# Patient Record
Sex: Female | Born: 1970
Health system: Southern US, Community
[De-identification: ages and names within clinical notes are randomized; demographics above are authoritative.]

## PROBLEM LIST (undated history)

## (undated) DIAGNOSIS — N809 Endometriosis, unspecified: Secondary | ICD-10-CM

## (undated) DIAGNOSIS — I10 Essential (primary) hypertension: Secondary | ICD-10-CM

## (undated) DIAGNOSIS — K219 Gastro-esophageal reflux disease without esophagitis: Secondary | ICD-10-CM

## (undated) HISTORY — PX: BREAST SURGERY: SHX581

## (undated) HISTORY — PX: EXCISION OF ENDOMETRIOMA: SHX6473

---

## 2002-02-20 ENCOUNTER — Encounter: Payer: Self-pay | Admitting: Orthopedic Surgery

## 2002-02-20 ENCOUNTER — Ambulatory Visit (HOSPITAL_COMMUNITY): Admission: RE | Admit: 2002-02-20 | Discharge: 2002-02-20 | Payer: Self-pay | Admitting: Orthopedic Surgery

## 2010-06-26 ENCOUNTER — Ambulatory Visit (HOSPITAL_COMMUNITY): Admission: RE | Admit: 2010-06-26 | Discharge: 2010-06-26 | Payer: Self-pay | Admitting: General Surgery

## 2011-02-23 LAB — CBC
HCT: 41.9 % (ref 36.0–46.0)
Hemoglobin: 14.4 g/dL (ref 12.0–15.0)
MCH: 31.8 pg (ref 26.0–34.0)
MCHC: 34.4 g/dL (ref 30.0–36.0)
MCV: 92.4 fL (ref 78.0–100.0)
Platelets: 352 10*3/uL (ref 150–400)
RBC: 4.53 MIL/uL (ref 3.87–5.11)
RDW: 13.2 % (ref 11.5–15.5)
WBC: 8.4 10*3/uL (ref 4.0–10.5)

## 2011-02-23 LAB — DIFFERENTIAL
Basophils Absolute: 0 10*3/uL (ref 0.0–0.1)
Basophils Relative: 1 % (ref 0–1)
Eosinophils Absolute: 0.2 10*3/uL (ref 0.0–0.7)
Eosinophils Relative: 2 % (ref 0–5)
Lymphocytes Relative: 22 % (ref 12–46)
Lymphs Abs: 1.9 10*3/uL (ref 0.7–4.0)
Monocytes Absolute: 0.6 10*3/uL (ref 0.1–1.0)
Monocytes Relative: 7 % (ref 3–12)
Neutro Abs: 5.7 10*3/uL (ref 1.7–7.7)
Neutrophils Relative %: 68 % (ref 43–77)

## 2011-02-23 LAB — HCG, QUANTITATIVE, PREGNANCY: hCG, Beta Chain, Quant, S: <2 m[IU]/mL (ref <5)

## 2011-02-23 LAB — SURGICAL PCR SCREEN
MRSA, PCR: POSITIVE — AB
Staphylococcus aureus: POSITIVE — AB

## 2017-09-22 ENCOUNTER — Emergency Department (HOSPITAL_BASED_OUTPATIENT_CLINIC_OR_DEPARTMENT_OTHER)
Admission: EM | Admit: 2017-09-22 | Discharge: 2017-09-22 | Disposition: A | Discharge status: Home / Self Care | Payer: Self-pay | Attending: Emergency Medicine | Admitting: Emergency Medicine

## 2017-09-22 ENCOUNTER — Encounter (HOSPITAL_BASED_OUTPATIENT_CLINIC_OR_DEPARTMENT_OTHER): Payer: Self-pay | Admitting: *Deleted

## 2017-09-22 DIAGNOSIS — Z79899 Other long term (current) drug therapy: Secondary | ICD-10-CM | POA: Insufficient documentation

## 2017-09-22 DIAGNOSIS — I10 Essential (primary) hypertension: Secondary | ICD-10-CM | POA: Insufficient documentation

## 2017-09-22 DIAGNOSIS — J0100 Acute maxillary sinusitis, unspecified: Secondary | ICD-10-CM | POA: Insufficient documentation

## 2017-09-22 DIAGNOSIS — J069 Acute upper respiratory infection, unspecified: Secondary | ICD-10-CM | POA: Insufficient documentation

## 2017-09-22 DIAGNOSIS — B9789 Other viral agents as the cause of diseases classified elsewhere: Secondary | ICD-10-CM | POA: Insufficient documentation

## 2017-09-22 HISTORY — DX: Essential (primary) hypertension: I10

## 2017-09-22 HISTORY — DX: Endometriosis, unspecified: N80.9

## 2017-09-22 HISTORY — DX: Gastro-esophageal reflux disease without esophagitis: K21.9

## 2017-09-22 MED ORDER — BENZONATATE 100 MG PO CAPS
100.0000 mg | ORAL_CAPSULE | Freq: Once | ORAL | Status: AC
  Administered 2017-09-22: 100 mg via ORAL
  Filled 2017-09-22: qty 1

## 2017-09-22 MED ORDER — DEXAMETHASONE 6 MG PO TABS
10.0000 mg | ORAL_TABLET | Freq: Once | ORAL | Status: AC
  Administered 2017-09-22: 10 mg via ORAL
  Filled 2017-09-22: qty 1

## 2017-09-22 MED ORDER — AMOXICILLIN-POT CLAVULANATE 875-125 MG PO TABS
1.0000 | ORAL_TABLET | Freq: Once | ORAL | Status: AC
  Administered 2017-09-22: 1 via ORAL
  Filled 2017-09-22: qty 1

## 2017-09-22 MED ORDER — BENZONATATE 100 MG PO CAPS: 100.0000 mg | ORAL_CAPSULE | Freq: Three times a day (TID) | ORAL | 0 refills | Status: DC

## 2017-09-22 MED ORDER — AMOXICILLIN-POT CLAVULANATE 875-125 MG PO TABS
1.0000 | ORAL_TABLET | Freq: Two times a day (BID) | ORAL | 0 refills | Status: DC
Start: 2017-09-22 — End: 2018-02-21

## 2017-09-22 MED FILL — AMOX-CLAV 875-125 MG TABLET: 875-125 | 7 days supply | Qty: 14 | Fill #0

## 2017-09-22 MED FILL — BENZONATATE 100 MG CAPSULE: 100 | 7 days supply | Qty: 21 | Fill #0

## 2017-09-22 NOTE — ED Triage Notes (Signed)
Pt reports sinus pressure, facial pain, cough x 1 week.

## 2017-09-22 NOTE — Discharge Instructions (Signed)
Take tylenol 2 pills 4 times a day and motrin 4 pills 3 times a day.  Drink plenty of fluids.  Return for worsening shortness of breath, headache, confusion. Follow up with your family doctor.   

## 2017-09-22 NOTE — ED Provider Notes (Addendum)
[facility] EMERGENCY DEPARTMENT Provider Note   CSN: [id number] Arrival date & time: 09/22/17  0755     History   Chief Complaint Chief Complaint  Patient presents with  . Cough    HPI Tammy Sanders is a 46 y.o. female.  46 yo F with a cc of cough and congestion going on for the past week.  Has had sick two year olds around her.  Denies smoking hx, denies fevers, chest pain, shortness of breath.  Her main concern is her right sided facial pain and significant nasal congestion.  Going on since yesterday.  Has been taking afirin for the past week initially with improvement.  Also taking mucinex and a decongestant.     The history is provided by the patient.  Illness  This is a new problem. The current episode started more than 1 week ago. The problem occurs constantly. The problem has been gradually worsening. Pertinent negatives include no chest pain, no headaches and no shortness of breath. Nothing aggravates the symptoms. Nothing relieves the symptoms. She has tried nothing for the symptoms. The treatment provided no relief.    Past Medical History:  Diagnosis Date  . Acid reflux   . Endometriosis   . Hypertension     There are no active problems to display for this patient.   History reviewed. No pertinent surgical history.  OB History    No data available       Home Medications    Prior to Admission medications   Medication Sig Start Date End Date Taking? Authorizing Provider  carvedilol (COREG) 12.5 MG tablet Take 12.5 mg by mouth 2 (two) times daily with a meal.   Yes [provider]  omeprazole (PRILOSEC) 20 MG capsule Take 20 mg by mouth daily.   Yes [provider]  amoxicillin-clavulanate (AUGMENTIN) 875-125 MG tablet Take 1 tablet by mouth every 12 (twelve) hours. 09/22/17   Melene Plan, DO  benzonatate (TESSALON) 100 MG capsule Take 1 capsule (100 mg total) by mouth every 8 (eight) hours. 09/22/17   Melene Plan, DO     Family History History reviewed. No pertinent family history.  Social History Social History  Substance Use Topics  . Smoking status: Never Smoker  . Smokeless tobacco: Never Used  . Alcohol use Not on file     Allergies   Patient has no known allergies.   Review of Systems Review of Systems  Constitutional: Negative for chills and fever.  HENT: Positive for congestion. Negative for rhinorrhea.   Eyes: Negative for redness and visual disturbance.  Respiratory: Positive for cough. Negative for shortness of breath and wheezing.   Cardiovascular: Negative for chest pain and palpitations.  Gastrointestinal: Negative for nausea and vomiting.  Genitourinary: Negative for dysuria and urgency.  Musculoskeletal: Negative for arthralgias and myalgias.  Skin: Negative for pallor and wound.  Neurological: Negative for dizziness and headaches.     Physical Exam Updated Vital Signs BP (!) 165/106 (BP Location: Right Arm)   Pulse 96   Temp 98.2 F (36.8 C) (Oral)   Resp 18   Ht 5' 7.5" (1.715 m)   Wt 84.8 kg (187 lb)   LMP 08/28/2017   SpO2 100%   BMI 28.86 kg/m   Physical Exam  Constitutional: She is oriented to person, place, and time. She appears well-developed and well-nourished. No distress.  HENT:  Head: Normocephalic and atraumatic.  Swollen turbinates, posterior nasal drip, R maxillary sinus TTP, tm normal bilaterally.  Eyes: Pupils are equal, round, and reactive to light. EOM are normal.  Neck: Normal range of motion. Neck supple.  Cardiovascular: Normal rate and regular rhythm.  Exam reveals no gallop and no friction rub.   No murmur heard. Pulmonary/Chest: Effort normal. She has no wheezes. She has no rales.  Abdominal: Soft. She exhibits no distension and no mass. There is no tenderness. There is no guarding.  Musculoskeletal: She exhibits no edema or tenderness.  Neurological: She is alert and oriented to person, place, and time.  Skin: Skin is warm  and dry. She is not diaphoretic.  Psychiatric: She has a normal mood and affect. Her behavior is normal.  Nursing note and vitals reviewed.    ED Treatments / Results  Labs (all labs ordered are listed, but only abnormal results are displayed) Labs Reviewed - No data to display  EKG  EKG Interpretation None       Radiology No results found.  Procedures Procedures (including critical care time)  Medications Ordered in ED Medications  dexamethasone (DECADRON) tablet 10 mg (not administered)  amoxicillin-clavulanate (AUGMENTIN) 875-125 MG per tablet 1 tablet (not administered)  benzonatate (TESSALON) capsule 100 mg (not administered)     Initial Impression / Assessment and Plan / ED Course  I have reviewed the triage vital signs and the nursing notes.  Pertinent labs & imaging results that were available during my care of the patient were reviewed by me and considered in my medical decision making (see chart for details).     46 yo F with URI like symptoms. She does have some significant right maxillary sinus tenderness. Will start her on Augmentin for sinusitis. I think most likely the patient has rebound congestion from using Afrin for a prolonged period. I discussed with her that this should be used for no more than 3 days at a time. She states that she normally gets steroids when she gets like this and her symptoms improve. I'll give her a dose of Decadron. The patient was also noted to be hypertensive here today. That could likely be due to her Afrin use. Have her follow-up with her family physician.  8:27 AM:  I have discussed the diagnosis/risks/treatment options with the patient and believe the pt to be eligible for discharge home to follow-up with PCP. We also discussed returning to the ED immediately if new or worsening sx occur. We discussed the sx which are most concerning (e.g., sob, confusion, fever, inability to tolerate by mouth) that necessitate immediate  return. Medications administered to the patient during their visit and any new prescriptions provided to the patient are listed below.  Medications given during this visit Medications  dexamethasone (DECADRON) tablet 10 mg (not administered)  amoxicillin-clavulanate (AUGMENTIN) 875-125 MG per tablet 1 tablet (not administered)  benzonatate (TESSALON) capsule 100 mg (not administered)     The patient appears reasonably screen and/or stabilized for discharge and I doubt any other medical condition or other [facility] requiring further screening, evaluation, or treatment in the ED at this time prior to discharge.    Final Clinical Impressions(s) / ED Diagnoses   Final diagnoses:  Acute maxillary sinusitis, recurrence not specified  Viral URI with cough    New Prescriptions New Prescriptions   AMOXICILLIN-CLAVULANATE (AUGMENTIN) 875-125 MG TABLET    Take 1 tablet by mouth every 12 (twelve) hours.   BENZONATATE (TESSALON) 100 MG CAPSULE    Take 1 capsule (100 mg total) by mouth every 8 (eight) hours.  Melene Plan, DO 09/22/17 [phone]

## 2017-11-11 ENCOUNTER — Encounter: Payer: Self-pay | Admitting: Obstetrics & Gynecology

## 2018-02-21 ENCOUNTER — Emergency Department (HOSPITAL_BASED_OUTPATIENT_CLINIC_OR_DEPARTMENT_OTHER)
Admission: EM | Admit: 2018-02-21 | Discharge: 2018-02-21 | Disposition: A | Discharge status: Home / Self Care | Payer: Self-pay | Attending: Emergency Medicine | Admitting: Emergency Medicine

## 2018-02-21 ENCOUNTER — Encounter (HOSPITAL_BASED_OUTPATIENT_CLINIC_OR_DEPARTMENT_OTHER): Payer: Self-pay | Admitting: *Deleted

## 2018-02-21 ENCOUNTER — Other Ambulatory Visit: Payer: Self-pay

## 2018-02-21 DIAGNOSIS — I159 Secondary hypertension, unspecified: Secondary | ICD-10-CM

## 2018-02-21 DIAGNOSIS — Z79899 Other long term (current) drug therapy: Secondary | ICD-10-CM | POA: Insufficient documentation

## 2018-02-21 DIAGNOSIS — I10 Essential (primary) hypertension: Secondary | ICD-10-CM | POA: Insufficient documentation

## 2018-02-21 NOTE — ED Notes (Signed)
ED Provider at bedside. 

## 2018-02-21 NOTE — ED Provider Notes (Signed)
[facility] EMERGENCY DEPARTMENT Provider Note  CSN: [id number] Arrival date & time: 02/21/18 1303  Chief Complaint(s) No chief complaint on file.  HPI Tammy Sanders is a 47 y.o. female   The history is provided by the patient.  Hypertension  This is a chronic problem. The problem occurs daily. The problem has not changed since onset.Pertinent negatives include no chest pain, no abdominal pain, no headaches and no shortness of breath. Nothing aggravates the symptoms. Nothing relieves the symptoms.   Currently on Coreg. Compliant.  Past Medical History Past Medical History:  Diagnosis Date  . Acid reflux   . Endometriosis   . Hypertension    There are no active problems to display for this patient.  Home Medication(s) Prior to Admission medications   Medication Sig Start Date End Date Taking? Authorizing Provider  carvedilol (COREG) 12.5 MG tablet Take 12.5 mg by mouth 2 (two) times daily with a meal.    [provider]  omeprazole (PRILOSEC) 20 MG capsule Take 20 mg by mouth daily.    [provider]                                                                                                                                    Past Surgical History History reviewed. No pertinent surgical history. Family History History reviewed. No pertinent family history.  Social History Social History   Tobacco Use  . Smoking status: Never Smoker  . Smokeless tobacco: Never Used  Substance Use Topics  . Alcohol use: Not on file  . Drug use: Not on file   Allergies Patient has no known allergies.  Review of Systems Review of Systems  Respiratory: Negative for shortness of breath.   Cardiovascular: Negative for chest pain.  Gastrointestinal: Negative for abdominal pain.  Neurological: Negative for headaches.   All other systems are reviewed and are negative for acute change except as noted in the HPI  Physical Exam Vital Signs  I have  reviewed the triage vital signs BP (!) 141/93 (BP Location: Right Arm)   Pulse 83   Temp 98.3 F (36.8 C) (Oral)   Resp 18   LMP 02/20/2018   SpO2 98%   Physical Exam  Constitutional: She is oriented to person, place, and time. She appears well-developed and well-nourished. No distress.  HENT:  Head: Normocephalic and atraumatic.  Nose: Nose normal.  Eyes: Conjunctivae and EOM are normal. Pupils are equal, round, and reactive to light. Right eye exhibits no discharge. Left eye exhibits no discharge. No scleral icterus.  Neck: Normal range of motion. Neck supple.  Cardiovascular: Normal rate and regular rhythm. Exam reveals no gallop and no friction rub.  No murmur heard. Pulmonary/Chest: Effort normal and breath sounds normal. No stridor. No respiratory distress. She has no rales.  Abdominal: Soft. She exhibits no distension. There is no tenderness.  Musculoskeletal: She exhibits no edema or tenderness.  Neurological: She is alert and oriented to person, place, and time.  Skin: Skin is warm and dry. No rash noted. She is not diaphoretic. No erythema.  Psychiatric: She has a normal mood and affect.  Vitals reviewed.   ED Results and Treatments Labs (all labs ordered are listed, but only abnormal results are displayed) Labs Reviewed - No data to display                                                                                                                       EKG  EKG Interpretation  Date/Time:    Ventricular Rate:    PR Interval:    QRS Duration:   QT Interval:    QTC Calculation:   R Axis:     Text Interpretation:        Radiology No results found. Pertinent labs & imaging results that were available during my care of the patient were reviewed by me and considered in my medical decision making (see chart for details).  Medications Ordered in ED Medications - No data to display                                                                                                                                   Procedures Procedures  (including critical care time)  Medical Decision Making / ED Course I have reviewed the nursing notes for this encounter and the patient's prior records (if available in EHR or on provided paperwork).    Asymptomatic HTN. Started new diet and exercise. Has f/u with PCP in 3 months. No need for diagnostics at this time.  The patient appears reasonably screened and/or stabilized for discharge and I doubt any other medical condition or other [facility] requiring further screening, evaluation, or treatment in the ED at this time prior to discharge.  The patient is safe for discharge with strict return precautions.   Final Clinical Impression(s) / ED Diagnoses Final diagnoses:  None   Disposition: Discharge  Condition: Good  I have discussed the results, Dx and Tx plan with the patient who expressed understanding and agree(s) with the plan. Discharge instructions discussed at great length. The patient was given strict return precautions who verbalized understanding of the instructions. No further questions at time of discharge.    ED Discharge Orders    None       Follow Up: Center, [facility] [address] [place] [id number] [phone]   as scheduled      This chart was dictated using voice recognition software.  Despite best efforts to proofread,  errors can occur which can change the documentation meaning.   Nira Connardama, Atul Delucia Eduardo, MD 02/22/18 [phone]

## 2018-02-21 NOTE — ED Triage Notes (Addendum)
Elevated BP x 1 week. States that she has been to her PCP 1 week ago and was told to 'watch it'.  Denies HA.

## 2019-01-12 ENCOUNTER — Encounter (HOSPITAL_BASED_OUTPATIENT_CLINIC_OR_DEPARTMENT_OTHER): Payer: Self-pay | Admitting: Emergency Medicine

## 2019-01-12 ENCOUNTER — Other Ambulatory Visit: Payer: Self-pay

## 2019-01-12 ENCOUNTER — Emergency Department (HOSPITAL_BASED_OUTPATIENT_CLINIC_OR_DEPARTMENT_OTHER)
Admission: EM | Admit: 2019-01-12 | Discharge: 2019-01-12 | Disposition: A | Discharge status: Home / Self Care | Payer: Self-pay | Attending: Emergency Medicine | Admitting: Emergency Medicine

## 2019-01-12 ENCOUNTER — Emergency Department (HOSPITAL_BASED_OUTPATIENT_CLINIC_OR_DEPARTMENT_OTHER): Payer: Self-pay

## 2019-01-12 DIAGNOSIS — I10 Essential (primary) hypertension: Secondary | ICD-10-CM | POA: Insufficient documentation

## 2019-01-12 DIAGNOSIS — J181 Lobar pneumonia, unspecified organism: Secondary | ICD-10-CM

## 2019-01-12 DIAGNOSIS — J189 Pneumonia, unspecified organism: Secondary | ICD-10-CM | POA: Insufficient documentation

## 2019-01-12 DIAGNOSIS — R05 Cough: Secondary | ICD-10-CM | POA: Insufficient documentation

## 2019-01-12 DIAGNOSIS — Z79899 Other long term (current) drug therapy: Secondary | ICD-10-CM | POA: Insufficient documentation

## 2019-01-12 DIAGNOSIS — R059 Cough, unspecified: Secondary | ICD-10-CM

## 2019-01-12 MED ORDER — DOXYCYCLINE HYCLATE 100 MG PO TABS
100.0000 mg | ORAL_TABLET | Freq: Once | ORAL | Status: AC
  Administered 2019-01-12: 100 mg via ORAL
  Filled 2019-01-12: qty 1

## 2019-01-12 MED ORDER — DOXYCYCLINE HYCLATE 100 MG PO CAPS: 100.0000 mg | ORAL_CAPSULE | Freq: Two times a day (BID) | ORAL | 0 refills | Status: AC

## 2019-01-12 NOTE — ED Notes (Signed)
Patient transported to X-ray 

## 2019-01-12 NOTE — Discharge Instructions (Signed)
Take doxycycline twice daily for a week for possible early pneumonia.   Continue albuterol as needed for wheezing   See your doctor  Return to ER if you have worse trouble breathing, cough, fevers.

## 2019-01-12 NOTE — ED Triage Notes (Signed)
Cough worsening x1 week.  Minimal yellow mucous. Had the flu a few weeks ago and never stopped coughing.

## 2019-01-12 NOTE — ED Provider Notes (Signed)
[facility] EMERGENCY DEPARTMENT Provider Note   CSN: [id number] Arrival date & time: 01/12/19  8676     History   Chief Complaint Chief Complaint  Patient presents with  . Cough    HPI BRINKLEE CRUSER is a 48 y.o. female history of reflux, hypertension here presenting with cough.  Patient states that several weeks ago, she was diagnosed with the flu and she finished a course of Tamiflu.  She states that since then she has been having persistent cough.  Over the last week, she states that the cough got worse and she has some yellowish sputum production.  Patient denies any fevers or chills or vomiting.  Patient denies any sick contacts.  She is prescribed albuterol and use it occasionally and last use was yesterday. Patient denies any hx of COPD or asthma, not a smoker.   The history is provided by the patient.    Past Medical History:  Diagnosis Date  . Acid reflux   . Endometriosis   . Hypertension     There are no active problems to display for this patient.   Past Surgical History:  Procedure Laterality Date  . BREAST SURGERY    . EXCISION OF ENDOMETRIOMA       OB History   No obstetric history on file.      Home Medications    Prior to Admission medications   Medication Sig Start Date End Date Taking? Authorizing Provider  carvedilol (COREG) 12.5 MG tablet Take 12.5 mg by mouth 2 (two) times daily with a meal.    [provider]  omeprazole (PRILOSEC) 20 MG capsule Take 20 mg by mouth daily.    [provider]    Family History No family history on file.  Social History Social History   Tobacco Use  . Smoking status: Never Smoker  . Smokeless tobacco: Never Used  Substance Use Topics  . Alcohol use: Never    Frequency: Never  . Drug use: Never     Allergies   Patient has no known allergies.   Review of Systems Review of Systems  Respiratory: Positive for cough.   All other systems reviewed and are  negative.    Physical Exam Updated Vital Signs BP 130/86 (BP Location: Right Arm)   Pulse 90   Temp 98.8 F (37.1 C) (Oral)   Resp 16   Ht 5\' 7"  (1.702 m)   Wt 81.2 kg   LMP 01/05/2019   SpO2 99%   BMI 28.04 kg/m   Physical Exam Vitals signs and nursing note reviewed.  Constitutional:      Appearance: Normal appearance.  HENT:     Head: Normocephalic.     Mouth/Throat:     Mouth: Mucous membranes are moist.  Eyes:     Pupils: Pupils are equal, round, and reactive to light.  Neck:     Musculoskeletal: Normal range of motion.  Cardiovascular:     Rate and Rhythm: Normal rate.  Pulmonary:     Effort: Pulmonary effort is normal.     Comments: No obvious wheezing or crackles, diminished on the bases  Abdominal:     General: Abdomen is flat.     Palpations: Abdomen is soft.  Musculoskeletal: Normal range of motion.  Skin:    General: Skin is warm.     Capillary Refill: Capillary refill takes less than 2 seconds.  Neurological:     General: No focal deficit present.     Mental Status:  She is alert.  Psychiatric:        Mood and Affect: Mood normal.      ED Treatments / Results  Labs (all labs ordered are listed, but only abnormal results are displayed) Labs Reviewed - No data to display  EKG None  Radiology No results found.  Procedures Procedures (including critical care time)  Medications Ordered in ED Medications - No data to display   Initial Impression / Assessment and Plan / ED Course  I have reviewed the triage vital signs and the nursing notes.  Pertinent labs & imaging results that were available during my care of the patient were reviewed by me and considered in my medical decision making (see chart for details).    HALLIE CHRISTOFFEL is a 48 y.o. female here with cough. Likely persistent cough after recent flu. Will get CXR to r/o pneumonia but she has no wheezing. She has albuterol at home and is not hypoxic.   7:56 AM Xray showed  possible L base atelectasis. Given that she has productive cough, will treat with doxycycline empirically for possible pneumonia.    Final Clinical Impressions(s) / ED Diagnoses   Final diagnoses:  None    ED Discharge Orders    None       Charlynne Pander, MD 01/12/19 [phone]

## 2019-01-24 ENCOUNTER — Other Ambulatory Visit: Payer: Self-pay

## 2019-01-24 ENCOUNTER — Encounter (HOSPITAL_BASED_OUTPATIENT_CLINIC_OR_DEPARTMENT_OTHER): Payer: Self-pay | Admitting: Emergency Medicine

## 2019-01-24 ENCOUNTER — Emergency Department (HOSPITAL_BASED_OUTPATIENT_CLINIC_OR_DEPARTMENT_OTHER)
Admission: EM | Admit: 2019-01-24 | Discharge: 2019-01-24 | Disposition: A | Discharge status: Home / Self Care | Payer: Self-pay | Attending: Emergency Medicine | Admitting: Emergency Medicine

## 2019-01-24 DIAGNOSIS — I10 Essential (primary) hypertension: Secondary | ICD-10-CM | POA: Insufficient documentation

## 2019-01-24 DIAGNOSIS — R0981 Nasal congestion: Secondary | ICD-10-CM

## 2019-01-24 DIAGNOSIS — Z79899 Other long term (current) drug therapy: Secondary | ICD-10-CM | POA: Insufficient documentation

## 2019-01-24 NOTE — ED Triage Notes (Addendum)
Pt states she has nasal congestion with cough, and stuffed up ears which is not improved since being recently sick.  Pt recently seen with flu and pneumonia.   Pt states her sinuses are bothering her.

## 2019-01-24 NOTE — ED Provider Notes (Signed)
[facility] EMERGENCY DEPARTMENT Provider Note   CSN: [id number] Arrival date & time: 01/24/19  2957     History   Chief Complaint Chief Complaint  Patient presents with  . Cough    HPI Tammy Sanders is a 48 y.o. female.  Patient has struggled for the last several weeks with a flulike illness and then a double course of pneumonia.  Recently seen here on February 5 started on doxycycline.  Chest x-ray on that day was not definitive for pneumonia.  But overall patient feeling better from a lung standpoint.  Patient now has a lot of sinus congestion and pressure in both ears.  Patient has a history of hypertension.  Patient was using Afrin for several days which did help some.  But now she is more congested.  Denies any fevers.  Denies any breathing problems.     Past Medical History:  Diagnosis Date  . Acid reflux   . Endometriosis   . Hypertension     There are no active problems to display for this patient.   Past Surgical History:  Procedure Laterality Date  . BREAST SURGERY    . EXCISION OF ENDOMETRIOMA       OB History   No obstetric history on file.      Home Medications    Prior to Admission medications   Medication Sig Start Date End Date Taking? Authorizing Provider  albuterol (PROVENTIL HFA;VENTOLIN HFA) 108 (90 Base) MCG/ACT inhaler INHALE 2 PUFFS BY MOUTH EVERY 4 TO 6 HOURS AS NEEDED FOR WHEEZING 12/31/18   [provider]  carvedilol (COREG) 12.5 MG tablet Take 12.5 mg by mouth 2 (two) times daily with a meal.    [provider]  cholecalciferol (VITAMIN D3) 25 MCG (1000 UT) tablet Take 5,000 Units by mouth daily.    [provider]  doxycycline (VIBRAMYCIN) 100 MG capsule Take 1 capsule (100 mg total) by mouth 2 (two) times daily. One po bid x 7 days 01/12/19   Charlynne Pander, MD  fenofibrate (TRICOR) 145 MG tablet Take 145 mg by mouth daily. 12/03/18   [provider]  omeprazole (PRILOSEC) 20 MG  capsule Take 20 mg by mouth daily.    [provider]    Family History No family history on file.  Social History Social History   Tobacco Use  . Smoking status: Never Smoker  . Smokeless tobacco: Never Used  Substance Use Topics  . Alcohol use: Never    Frequency: Never  . Drug use: Never     Allergies   Patient has no known allergies.   Review of Systems Review of Systems  Constitutional: Negative for chills and fever.  HENT: Positive for congestion and ear pain. Negative for rhinorrhea and sore throat.   Eyes: Negative for visual disturbance.  Respiratory: Negative for cough and shortness of breath.   Cardiovascular: Negative for chest pain and leg swelling.  Gastrointestinal: Negative for abdominal pain, diarrhea, nausea and vomiting.  Genitourinary: Negative for dysuria.  Musculoskeletal: Negative for back pain and neck pain.  Skin: Negative for rash.  Neurological: Negative for dizziness, light-headedness and headaches.  Hematological: Does not bruise/bleed easily.  Psychiatric/Behavioral: Negative for confusion.     Physical Exam Updated Vital Signs BP 122/69   Pulse 95   Temp 98.6 F (37 C) (Oral)   Ht 1.702 m (5\' 7" )   Wt 80.7 kg   LMP 01/05/2019   SpO2 100%   BMI 27.88 kg/m  Physical Exam Vitals signs and nursing note reviewed.  Constitutional:      General: She is not in acute distress.    Appearance: She is well-developed.  HENT:     Head: Normocephalic and atraumatic.     Right Ear: Tympanic membrane and external ear normal.     Left Ear: Tympanic membrane and external ear normal.     Nose: Congestion present.     Mouth/Throat:     Mouth: Mucous membranes are moist.  Eyes:     Extraocular Movements: Extraocular movements intact.     Conjunctiva/sclera: Conjunctivae normal.     Pupils: Pupils are equal, round, and reactive to light.  Neck:     Musculoskeletal: Normal range of motion and neck supple.  Cardiovascular:      Rate and Rhythm: Normal rate and regular rhythm.     Heart sounds: Normal heart sounds. No murmur.  Pulmonary:     Effort: Pulmonary effort is normal. No respiratory distress.     Breath sounds: Normal breath sounds. No wheezing.  Abdominal:     Palpations: Abdomen is soft.     Tenderness: There is no abdominal tenderness.  Musculoskeletal: Normal range of motion.  Skin:    General: Skin is warm and dry.  Neurological:     General: No focal deficit present.     Mental Status: She is alert and oriented to person, place, and time.      ED Treatments / Results  Labs (all labs ordered are listed, but only abnormal results are displayed) Labs Reviewed - No data to display  EKG None  Radiology No results found.  Procedures Procedures (including critical care time)  Medications Ordered in ED Medications - No data to display   Initial Impression / Assessment and Plan / ED Course  I have reviewed the triage vital signs and the nursing notes.  Pertinent labs & imaging results that were available during my care of the patient were reviewed by me and considered in my medical decision making (see chart for details).     Patient nontoxic no acute distress.  Patient with a lot of sinus congestion but no significant pressure.  Both TMs are normal.  Patient has a history of hypertension.  So would recommend Coricidin as a decongestion.  Patient seems to be improving from the flulike and pneumonia that she has had her lungs are clear today.  Final Clinical Impressions(s) / ED Diagnoses   Final diagnoses:  Congestion of nasal sinus    ED Discharge Orders    None       Vanetta Mulders, MD 01/24/19 [phone]

## 2019-01-24 NOTE — Discharge Instructions (Addendum)
Recommend use of Coricidin has a decongestion.  Available over-the-counter.  Work note provided.  Make an appointment to follow-up with your doctors.  Return for any new or worse symptoms.
